# Patient Record
Sex: Female | Born: 2000 | Race: White | Hispanic: No | Marital: Single | Smoking: Never smoker
Health system: Southern US, Community
[De-identification: ages and names within clinical notes are randomized; demographics above are authoritative.]

## PROBLEM LIST (undated history)

## (undated) HISTORY — PX: WISDOM TOOTH EXTRACTION: SHX21

---
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
## 2022-02-14 ENCOUNTER — Other Ambulatory Visit: Payer: Self-pay

## 2022-02-14 ENCOUNTER — Emergency Department (HOSPITAL_COMMUNITY): Payer: Medicaid Other

## 2022-02-14 ENCOUNTER — Encounter (HOSPITAL_COMMUNITY): Payer: Self-pay | Admitting: *Deleted

## 2022-02-14 ENCOUNTER — Emergency Department (HOSPITAL_COMMUNITY)
Admission: EM | Admit: 2022-02-14 | Discharge: 2022-02-14 | Disposition: A | Discharge status: Home / Self Care | Payer: Medicaid Other | Attending: Emergency Medicine | Admitting: Emergency Medicine

## 2022-02-14 DIAGNOSIS — R350 Frequency of micturition: Secondary | ICD-10-CM | POA: Diagnosis present

## 2022-02-14 DIAGNOSIS — N3001 Acute cystitis with hematuria: Secondary | ICD-10-CM | POA: Insufficient documentation

## 2022-02-14 DIAGNOSIS — N200 Calculus of kidney: Secondary | ICD-10-CM | POA: Diagnosis not present

## 2022-02-14 DIAGNOSIS — R109 Unspecified abdominal pain: Secondary | ICD-10-CM | POA: Diagnosis not present

## 2022-02-14 DIAGNOSIS — N3289 Other specified disorders of bladder: Secondary | ICD-10-CM | POA: Diagnosis not present

## 2022-02-14 LAB — COMPREHENSIVE METABOLIC PANEL
ALT: 33 U/L (ref 0–44)
AST: 23 U/L (ref 15–41)
Albumin: 4.6 g/dL (ref 3.5–5.0)
Alkaline Phosphatase: 54 U/L (ref 38–126)
Anion gap: 7 (ref 5–15)
BUN: 15 mg/dL (ref 6–20)
CO2: 24 mmol/L (ref 22–32)
Calcium: 9.6 mg/dL (ref 8.9–10.3)
Chloride: 103 mmol/L (ref 98–111)
Creatinine, Ser: 0.62 mg/dL (ref 0.44–1.00)
GFR, Estimated: >60 mL/min (ref >60)
Glucose, Bld: 103 mg/dL — ABNORMAL HIGH (ref 70–99)
Potassium: 3.6 mmol/L (ref 3.5–5.1)
Sodium: 134 mmol/L — ABNORMAL LOW (ref 135–145)
Total Bilirubin: 0.5 mg/dL (ref 0.3–1.2)
Total Protein: 8.7 g/dL — ABNORMAL HIGH (ref 6.5–8.1)

## 2022-02-14 LAB — PREGNANCY, URINE: Preg Test, Ur: NEGATIVE

## 2022-02-14 LAB — URINALYSIS, ROUTINE W REFLEX MICROSCOPIC
Bilirubin Urine: NEGATIVE
Glucose, UA: NEGATIVE mg/dL
Ketones, ur: NEGATIVE mg/dL
Nitrite: NEGATIVE
Protein, ur: 30 mg/dL — AB
Specific Gravity, Urine: 1.012 (ref 1.005–1.030)
pH: 8 (ref 5.0–8.0)

## 2022-02-14 LAB — CBC WITH DIFFERENTIAL/PLATELET
Abs Immature Granulocytes: 0.02 10*3/uL (ref 0.00–0.07)
Basophils Absolute: 0 10*3/uL (ref 0.0–0.1)
Basophils Relative: 0 %
Eosinophils Absolute: 0 10*3/uL (ref 0.0–0.5)
Eosinophils Relative: 0 %
HCT: 41.4 % (ref 36.0–46.0)
Hemoglobin: 13.9 g/dL (ref 12.0–15.0)
Immature Granulocytes: 0 %
Lymphocytes Relative: 19 %
Lymphs Abs: 1.5 10*3/uL (ref 0.7–4.0)
MCH: 29.3 pg (ref 26.0–34.0)
MCHC: 33.6 g/dL (ref 30.0–36.0)
MCV: 87.3 fL (ref 80.0–100.0)
Monocytes Absolute: 0.6 10*3/uL (ref 0.1–1.0)
Monocytes Relative: 7 %
Neutro Abs: 5.8 10*3/uL (ref 1.7–7.7)
Neutrophils Relative %: 74 %
Platelets: 237 10*3/uL (ref 150–400)
RBC: 4.74 MIL/uL (ref 3.87–5.11)
RDW: 12.4 % (ref 11.5–15.5)
WBC: 8 10*3/uL (ref 4.0–10.5)
nRBC: 0 % (ref 0.0–0.2)

## 2022-02-14 MED ORDER — TAMSULOSIN HCL 0.4 MG PO CAPS: 0.4000 mg | ORAL_CAPSULE | Freq: Every day | ORAL | 0 refills | Status: AC

## 2022-02-14 MED ORDER — HYDROCODONE-ACETAMINOPHEN 5-325 MG PO TABS: 1.0000 | ORAL_TABLET | Freq: Four times a day (QID) | ORAL | 0 refills | Status: AC | PRN

## 2022-02-14 MED ORDER — SODIUM CHLORIDE 0.9 % IV BOLUS
1000.0000 mL | Freq: Once | INTRAVENOUS | Status: AC
  Administered 2022-02-14: 1000 mL via INTRAVENOUS

## 2022-02-14 MED ORDER — KETOROLAC TROMETHAMINE 30 MG/ML IJ SOLN
15.0000 mg | Freq: Once | INTRAMUSCULAR | Status: AC
  Administered 2022-02-14: 15 mg via INTRAVENOUS
  Filled 2022-02-14: qty 1

## 2022-02-14 MED ORDER — NITROFURANTOIN MONOHYD MACRO 100 MG PO CAPS: 100.0000 mg | ORAL_CAPSULE | Freq: Two times a day (BID) | ORAL | 0 refills | Status: AC

## 2022-02-14 MED ORDER — TAMSULOSIN HCL 0.4 MG PO CAPS
0.4000 mg | ORAL_CAPSULE | Freq: Once | ORAL | Status: AC
  Administered 2022-02-14: 0.4 mg via ORAL
  Filled 2022-02-14: qty 1

## 2022-02-14 MED ORDER — NITROFURANTOIN MONOHYD MACRO 100 MG PO CAPS
100.0000 mg | ORAL_CAPSULE | Freq: Once | ORAL | Status: AC
  Administered 2022-02-14: 100 mg via ORAL
  Filled 2022-02-14: qty 1

## 2022-02-14 NOTE — ED Triage Notes (Signed)
Pt with right flank pain since waking up this morning and gradually getting worse throughout the day. Denies any burning with urination or blood in urine.

## 2022-02-14 NOTE — ED Provider Notes (Signed)
[facility] EMERGENCY DEPARTMENT Provider Note   CSN: [id number] Arrival date & time: 02/14/22  1421     History  Chief Complaint  Patient presents with   Flank Pain    right    Samantha Pollard is a 21 y.o. female.   Flank Pain Pertinent negatives include no chest pain, no abdominal pain and no shortness of breath.   21 year old female presents emergency department with complaints of right-sided flank pain.  Patient states that pain began upon awakening this morning around 7 AM.  Is been constant nature since been intermittent and intensity.  Pain described as sharp in her right flank with radiation to her right groin.  Patient denies urinary symptoms including hematuria, dysuria.  Patient has noticed urinary frequency/urgency new onset today. Denies history of kidney stones in the past, fever, chills, abdominal pain, nausea/vomiting/diarrhea, chest pain, shortness of breath, vaginal symptoms.  Home Medications Prior to Admission medications   Medication Sig Start Date End Date Taking? Authorizing Provider  HYDROcodone-acetaminophen (NORCO/VICODIN) 5-325 MG tablet Take 1 tablet by mouth every 6 (six) hours as needed for severe pain. 02/14/22  Yes Sherian Maroon A, PA  nitrofurantoin, macrocrystal-monohydrate, (MACROBID) 100 MG capsule Take 1 capsule (100 mg total) by mouth 2 (two) times daily. 02/14/22  Yes Sherian Maroon A, PA  tamsulosin (FLOMAX) 0.4 MG CAPS capsule Take 1 capsule (0.4 mg total) by mouth daily. 02/14/22  Yes Sherian Maroon A, PA  drospirenone-ethinyl estradiol (YAZ) 3-0.02 MG tablet Take 1 tablet by mouth daily. 09/30/21   Gwenlyn Fudge, FNP  olopatadine (PATANOL) 0.1 % ophthalmic solution Place 1 drop into the right eye 2 (two) times daily. 11/14/21   Burgess Amor, PA-C  tretinoin (RETIN-A) 0.1 % cream Apply topically at bedtime. 09/30/21   Gwenlyn Fudge, FNP      Allergies    Patient has no known allergies.    Review of Systems   Review of Systems   Constitutional:  Negative for chills and fever.  HENT:  Negative for ear pain and sore throat.   Eyes:  Negative for pain and visual disturbance.  Respiratory:  Negative for cough and shortness of breath.   Cardiovascular:  Negative for chest pain and palpitations.  Gastrointestinal:  Negative for abdominal pain and vomiting.  Genitourinary:  Positive for flank pain, frequency and urgency. Negative for dysuria and hematuria.  Musculoskeletal:  Negative for arthralgias and back pain.  Skin:  Negative for color change and rash.  Neurological:  Negative for seizures and syncope.  All other systems reviewed and are negative.   Physical Exam Updated Vital Signs BP 102/70 (BP Location: Right Arm)   Pulse 70   Temp 97.9 F (36.6 C) (Oral)   Resp 16   Ht 5\' 1"  (1.549 m)   Wt 64.9 kg   LMP 01/31/2022   SpO2 100%   BMI 27.02 kg/m  Physical Exam Vitals and nursing note reviewed.  Constitutional:      General: She is not in acute distress.    Appearance: She is well-developed.  HENT:     Head: Normocephalic and atraumatic.     Nose: Nose normal.     Mouth/Throat:     Mouth: Mucous membranes are moist.     Pharynx: Oropharynx is clear.  Eyes:     Conjunctiva/sclera: Conjunctivae normal.  Cardiovascular:     Rate and Rhythm: Normal rate and regular rhythm.     Heart sounds: No murmur heard. Pulmonary:     Effort:  Pulmonary effort is normal. No respiratory distress.     Breath sounds: Normal breath sounds.  Abdominal:     Palpations: Abdomen is soft.     Tenderness: There is no abdominal tenderness. There is right CVA tenderness.  Musculoskeletal:        General: No swelling.     Cervical back: Neck supple.     Right lower leg: No edema.     Left lower leg: No edema.  Skin:    General: Skin is warm and dry.     Capillary Refill: Capillary refill takes less than 2 seconds.  Neurological:     General: No focal deficit present.     Mental Status: She is alert and oriented to  person, place, and time.  Psychiatric:        Mood and Affect: Mood normal.     ED Results / Procedures / Treatments   Labs (all labs ordered are listed, but only abnormal results are displayed) Labs Reviewed  URINALYSIS, ROUTINE W REFLEX MICROSCOPIC - Abnormal; Notable for the following components:      Result Value   Color, Urine STRAW (*)    Hgb urine dipstick SMALL (*)    Protein, ur 30 (*)    Leukocytes,Ua TRACE (*)    Bacteria, UA RARE (*)    All other components within normal limits  COMPREHENSIVE METABOLIC PANEL - Abnormal; Notable for the following components:   Sodium 134 (*)    Glucose, Bld 103 (*)    Total Protein 8.7 (*)    All other components within normal limits  PREGNANCY, URINE  CBC WITH DIFFERENTIAL/PLATELET    EKG None  Radiology CT Renal Stone Study  Result Date: 02/14/2022 CLINICAL DATA:  Right-sided flank pain for several hours EXAM: CT ABDOMEN AND PELVIS WITHOUT CONTRAST TECHNIQUE: Multidetector CT imaging of the abdomen and pelvis was performed following the standard protocol without IV contrast. RADIATION DOSE REDUCTION: This exam was performed according to the departmental dose-optimization program which includes automated exposure control, adjustment of the mA and/or kV according to patient size and/or use of iterative reconstruction technique. COMPARISON:  09/25/2017 FINDINGS: Lower chest: No acute abnormality. Hepatobiliary: No focal liver abnormality is seen. No gallstones, gallbladder wall thickening, or biliary dilatation. Pancreas: Unremarkable. No pancreatic ductal dilatation or surrounding inflammatory changes. Spleen: Normal in size without focal abnormality. Adrenals/Urinary Tract: Adrenal glands are within normal limits. Kidneys are well visualized bilaterally. No renal calculi are seen. Mild fullness of the right renal collecting system and right ureter is noted which extends to the level of the ureterovesical junction. Bladder is partially  distended. Stomach/Bowel: No obstructive or inflammatory changes of the colon are noted. The appendix is well visualized and within normal limits. Small bowel and stomach are within normal limits. Vascular/Lymphatic: No significant vascular findings are present. No enlarged abdominal or pelvic lymph nodes. Reproductive: Uterus and bilateral adnexa are unremarkable. Other: No abdominal wall hernia or abnormality. No abdominopelvic ascites. Musculoskeletal: No acute or significant osseous findings. IMPRESSION: Fullness of the right renal collecting system and right ureter extending to the level of the bladder as described. No definitive stone is seen. These changes are likely related to a poorly calcified stone distally or edema from recently passed stone. No other focal abnormality is noted. Electronically Signed   By: Alcide Clever M.D.   On: 02/14/2022 21:28    Procedures Procedures    Medications Ordered in ED Medications  tamsulosin (FLOMAX) capsule 0.4 mg (has no administration in  time range)  nitrofurantoin (macrocrystal-monohydrate) (MACROBID) capsule 100 mg (has no administration in time range)  ketorolac (TORADOL) 30 MG/ML injection 15 mg (15 mg Intravenous Given 02/14/22 2107)  sodium chloride 0.9 % bolus 1,000 mL (1,000 mLs Intravenous New Bag/Given 02/14/22 2108)    ED Course/ Medical Decision Making/ A&P                           Medical Decision Making Amount and/or Complexity of Data Reviewed Labs: ordered. Radiology: ordered.  Risk Prescription drug management.   This patient presents to the ED for concern of right-sided flank pain, this involves an extensive number of treatment options, and is a complaint that carries with it a high risk of complications and morbidity.  The differential diagnosis includes but are not limited to AAA, mesenteric ischemia, appendicitis, diverticulitis, DKA, gastritis, gastroenteritis, AMI, nephrolithiasis, pancreatitis, peritonitis, adrenal  insufficiency,lead poisoning, iron toxicity, intestinal ischemia, constipation, UTI,SBO/LBO, splenic rupture, biliary disease, IBD, IBS, PUD, or hepatitis. Ectopic pregnancy, ovarian torsion, PID.   Co morbidities that complicate the patient evaluation  N/a   Additional history obtained:  Additional history obtained from mother who is at bedside   Lab Tests:  I Ordered, and personally interpreted labs.  The pertinent results include: UA significant for small hemoglobin, 30 protein, trace leukocytes, RBC 11-20, WBC 11-20, rare bacteria.   Imaging Studies ordered:  I ordered imaging studies including CT renal stone I independently visualized and interpreted imaging which showed fullness of the right renal collecting system and right ureter extending to the level of the bladder as described.  No definitive stone is seen.  These changes are likely related to a poorly calcified stone distally or edema from recently passed stone. I agree with the radiologist interpretation  Cardiac Monitoring: / EKG:  The patient was maintained on a cardiac monitor.  I personally viewed and interpreted the cardiac monitored which showed an underlying rhythm of: Sinus rhythm   Consultations Obtained:  N/a   Problem List / ED Course / Critical interventions / Medication management  Right flank pain I ordered medication including Toradol for pain and normal saline   Reevaluation of the patient after these medicines showed that the patient improved I have reviewed the patients home medicines and have made adjustments as needed   Social Determinants of Health:  Denies tobacco, illicit drug use.   Test / Admission - Considered:  Right-sided flank pain Vitals signs significant within normal range and stable throughout visit. Laboratory/imaging studies significant for: UA significant for urinary tract infection as well as CT study indicative of either radio lucent renal stone or recently passed  stone. Patient symptoms are likely secondary to either radiolucent right-sided nephrolithiasis or recently passed nephrolithiasis.  We will treat with Flomax and pain medication outpatient.  Also superimposes a urinary tract infection will to be treated with antibiotics for the next 5 days.  Ambulatory referral set up for urology.  Instructed the patient and family to call the urologist office tomorrow to try to get in sooner. Worrisome signs and symptoms were discussed with the patient, and the patient acknowledged understanding to return to the ED if noticed. Patient was stable upon discharge.          Final Clinical Impression(s) / ED Diagnoses Final diagnoses:  Right flank pain  Acute cystitis with hematuria  Nephrolithiasis    Rx / DC Orders ED Discharge Orders          Ordered  Ambulatory referral to Urology       Comments: R Nephrolithiasis   02/14/22 2152    tamsulosin (FLOMAX) 0.4 MG CAPS capsule  Daily        02/14/22 2152    HYDROcodone-acetaminophen (NORCO/VICODIN) 5-325 MG tablet  Every 6 hours PRN        02/14/22 2152    nitrofurantoin, macrocrystal-monohydrate, (MACROBID) 100 MG capsule  2 times daily        02/14/22 2152              Peter Garter, [place] 02/14/22 2159    Gloris Manchester, MD 02/15/22 [phone]

## 2022-02-14 NOTE — Discharge Instructions (Addendum)
ForYour visit today was significant for a right-sided kidney stone as well as a urinary tract infection.  Flomax or tamsulosin to take every day as he experienced pain.  The antibiotic for urinary tract infection is called Macrobid.  You were given your first dose today.  His antibiotic you will take twice a day for the next 5 days.  I have prescribed pain medicine to take as needed until you are able to see her urologist.  An amatory referral to urology has been set up with [facility] [place].  They should call you within the next 2 to 3 days, but you can get in sooner if you call them early tomorrow morning.  They should have your records as well as your visit tonight.  Please do not hesitate to return to the emergency department if the worrisome signs and symptoms we discussed become apparent.

## 2022-02-15 ENCOUNTER — Ambulatory Visit (INDEPENDENT_AMBULATORY_CARE_PROVIDER_SITE_OTHER): Payer: Medicaid Other | Admitting: Urology

## 2022-02-15 VITALS — BP 107/72 | HR 99

## 2022-02-15 DIAGNOSIS — N12 Tubulo-interstitial nephritis, not specified as acute or chronic: Secondary | ICD-10-CM

## 2022-02-15 DIAGNOSIS — N200 Calculus of kidney: Secondary | ICD-10-CM

## 2022-02-15 LAB — URINALYSIS, ROUTINE W REFLEX MICROSCOPIC
Bilirubin, UA: NEGATIVE
Glucose, UA: NEGATIVE
Ketones, UA: NEGATIVE
Nitrite, UA: NEGATIVE
RBC, UA: NEGATIVE
Specific Gravity, UA: 1.015 (ref 1.005–1.030)
Urobilinogen, Ur: 0.2 mg/dL (ref 0.2–1.0)
pH, UA: 5.5 (ref 5.0–7.5)

## 2022-02-15 LAB — MICROSCOPIC EXAMINATION: RBC, Urine: NONE SEEN /hpf (ref 0–2)

## 2022-02-15 MED ORDER — FLUCONAZOLE 150 MG PO TABS: 150.0000 mg | ORAL_TABLET | Freq: Every day | ORAL | 1 refills | Status: AC

## 2022-02-15 MED ORDER — SULFAMETHOXAZOLE-TRIMETHOPRIM 800-160 MG PO TABS: 1.0000 | ORAL_TABLET | Freq: Two times a day (BID) | ORAL | 0 refills | Status: AC

## 2022-02-15 NOTE — Progress Notes (Signed)
02/15/2022 9:25 AM   Samantha Pollard 2001-05-16 [id number]  Referring provider: Gwenlyn Fudge, FNP [address] [place],  [place] [address]  Right flank pain   HPI: Samantha Pollard is a 20yo here for evaluation of right hydronephrosis. She developed right flank pain 1 week ago and presented to the ER on 6/19. She underwent CT on 6/19 which showed mild right hydronephrosis to the level of the UVJ without definitive calculus. UA in the ER was concerning for infection and patient was given prescription for macrobid. She denies any fevers. No history of nephrolithiasis and no family history of nephrolithiasis. UA today is concerning for infection.   PMH: No past medical history on file.  Surgical History: Past Surgical History:  Procedure Laterality Date   WISDOM TOOTH EXTRACTION      Home Medications:  Allergies as of 02/15/2022   No Known Allergies      Medication List        Accurate as of February 15, 2022  9:25 AM. If you have any questions, ask your nurse or doctor.          drospirenone-ethinyl estradiol 3-0.02 MG tablet Commonly known as: YAZ Take 1 tablet by mouth daily.   HYDROcodone-acetaminophen 5-325 MG tablet Commonly known as: NORCO/VICODIN Take 1 tablet by mouth every 6 (six) hours as needed for severe pain.   nitrofurantoin (macrocrystal-monohydrate) 100 MG capsule Commonly known as: MACROBID Take 1 capsule (100 mg total) by mouth 2 (two) times daily.   olopatadine 0.1 % ophthalmic solution Commonly known as: Patanol Place 1 drop into the right eye 2 (two) times daily.   tamsulosin 0.4 MG Caps capsule Commonly known as: FLOMAX Take 1 capsule (0.4 mg total) by mouth daily.   tretinoin 0.1 % cream Commonly known as: RETIN-A Apply topically at bedtime.        Allergies: No Known Allergies  Family History: Family History  Problem Relation Age of Onset   Heart murmur Mother    Hyperlipidemia Mother    Thyroid disease Father    Hyperlipidemia  Maternal Grandmother    Skin cancer Maternal Grandmother    Hypertension Maternal Grandfather     Social History:  reports that she has never smoked. She has never used smokeless tobacco. She reports that she does not drink alcohol and does not use drugs.  ROS: All other review of systems were reviewed and are negative except what is noted above in HPI  Physical Exam: BP 107/72   Pulse 99   LMP 01/31/2022   Constitutional:  Alert and oriented, No acute distress. HEENT: [place] AT, moist mucus membranes.  Trachea midline, no masses. Cardiovascular: No clubbing, cyanosis, or edema. Respiratory: Normal respiratory effort, no increased work of breathing. GI: Abdomen is soft, nontender, nondistended, no abdominal masses GU: No CVA tenderness.  Lymph: No cervical or inguinal lymphadenopathy. Skin: No rashes, bruises or suspicious lesions. Neurologic: Grossly intact, no focal deficits, moving all 4 extremities. Psychiatric: Normal mood and affect.  Laboratory Data: Lab Results  Component Value Date   WBC 8.0 02/14/2022   HGB 13.9 02/14/2022   HCT 41.4 02/14/2022   MCV 87.3 02/14/2022   PLT 237 02/14/2022    Lab Results  Component Value Date   CREATININE 0.62 02/14/2022    No results found for: "PSA"  No results found for: "TESTOSTERONE"  No results found for: "HGBA1C"  Urinalysis    Component Value Date/Time   COLORURINE STRAW (A) 02/14/2022 1520   APPEARANCEUR CLEAR  02/14/2022 1520   LABSPEC 1.012 02/14/2022 1520   PHURINE 8.0 02/14/2022 1520   GLUCOSEU NEGATIVE 02/14/2022 1520   HGBUR SMALL (A) 02/14/2022 1520   BILIRUBINUR NEGATIVE 02/14/2022 1520   KETONESUR NEGATIVE 02/14/2022 1520   PROTEINUR 30 (A) 02/14/2022 1520   NITRITE NEGATIVE 02/14/2022 1520   LEUKOCYTESUR TRACE (A) 02/14/2022 1520    Lab Results  Component Value Date   BACTERIA RARE (A) 02/14/2022    Pertinent Imaging: CT 02/14/2022: Images reviewed and discussed with the patient  Results for  orders placed during the hospital encounter of 11/27/06  DG Abd 1 View  Narrative Clinical Data: Vomiting. Incontinence. PORTABLE ABDOMEN: Findings: Mildly distended colon is noted with some fluid and stool. There are no dilated small bowel loops present. No abnormal calcifications are identified. The bony structures are within normal limits.  Impression Nonspecific bowel gas pattern with mildly distended colon.  Provider: Fransico Setters  No results found for this or any previous visit.  No results found for this or any previous visit.  No results found for this or any previous visit.  No results found for this or any previous visit.  No results found for this or any previous visit.  No results found for this or any previous visit.  Results for orders placed during the hospital encounter of 02/14/22  CT Renal Stone Study  Narrative CLINICAL DATA:  Right-sided flank pain for several hours  EXAM: CT ABDOMEN AND PELVIS WITHOUT CONTRAST  TECHNIQUE: Multidetector CT imaging of the abdomen and pelvis was performed following the standard protocol without IV contrast.  RADIATION DOSE REDUCTION: This exam was performed according to the departmental dose-optimization program which includes automated exposure control, adjustment of the mA and/or kV according to patient size and/or use of iterative reconstruction technique.  COMPARISON:  09/25/2017  FINDINGS: Lower chest: No acute abnormality.  Hepatobiliary: No focal liver abnormality is seen. No gallstones, gallbladder wall thickening, or biliary dilatation.  Pancreas: Unremarkable. No pancreatic ductal dilatation or surrounding inflammatory changes.  Spleen: Normal in size without focal abnormality.  Adrenals/Urinary Tract: Adrenal glands are within normal limits. Kidneys are well visualized bilaterally. No renal calculi are seen. Mild fullness of the right renal collecting system and right ureter is noted which  extends to the level of the ureterovesical junction. Bladder is partially distended.  Stomach/Bowel: No obstructive or inflammatory changes of the colon are noted. The appendix is well visualized and within normal limits. Small bowel and stomach are within normal limits.  Vascular/Lymphatic: No significant vascular findings are present. No enlarged abdominal or pelvic lymph nodes.  Reproductive: Uterus and bilateral adnexa are unremarkable.  Other: No abdominal wall hernia or abnormality. No abdominopelvic ascites.  Musculoskeletal: No acute or significant osseous findings.  IMPRESSION: Fullness of the right renal collecting system and right ureter extending to the level of the bladder as described. No definitive stone is seen. These changes are likely related to a poorly calcified stone distally or edema from recently passed stone.  No other focal abnormality is noted.   Electronically Signed By: Alcide Clever M.D. On: 02/14/2022 21:28   Assessment & Plan:    1. pyelonephritis -urine for culture -Bactrim DS BID for 14 days -RTC 1 month with renal US - Urinalysis, Routine w reflex microscopic   No follow-ups on file.  Wilkie Aye, MD  [facility]

## 2022-02-17 LAB — URINE CULTURE

## 2022-02-22 ENCOUNTER — Encounter: Payer: Self-pay | Admitting: Urology

## 2022-03-18 DIAGNOSIS — N3000 Acute cystitis without hematuria: Secondary | ICD-10-CM | POA: Diagnosis not present

## 2022-03-18 DIAGNOSIS — N390 Urinary tract infection, site not specified: Secondary | ICD-10-CM | POA: Diagnosis present

## 2022-03-19 ENCOUNTER — Encounter (HOSPITAL_COMMUNITY): Payer: Self-pay | Admitting: Emergency Medicine

## 2022-03-19 ENCOUNTER — Emergency Department (HOSPITAL_COMMUNITY)
Admission: EM | Admit: 2022-03-19 | Discharge: 2022-03-19 | Disposition: A | Discharge status: Home / Self Care | Payer: Medicaid Other | Attending: Emergency Medicine | Admitting: Emergency Medicine

## 2022-03-19 DIAGNOSIS — N3000 Acute cystitis without hematuria: Secondary | ICD-10-CM

## 2022-03-19 LAB — URINALYSIS, ROUTINE W REFLEX MICROSCOPIC
Bilirubin Urine: NEGATIVE
Glucose, UA: NEGATIVE mg/dL
Hgb urine dipstick: NEGATIVE
Ketones, ur: NEGATIVE mg/dL
Nitrite: NEGATIVE
Protein, ur: NEGATIVE mg/dL
Specific Gravity, Urine: 1.023 (ref 1.005–1.030)
pH: 7 (ref 5.0–8.0)

## 2022-03-19 LAB — PREGNANCY, URINE: Preg Test, Ur: NEGATIVE

## 2022-03-19 MED ORDER — CEPHALEXIN 500 MG PO CAPS
500.0000 mg | ORAL_CAPSULE | Freq: Once | ORAL | Status: AC
  Administered 2022-03-19: 500 mg via ORAL
  Filled 2022-03-19: qty 1

## 2022-03-19 MED ORDER — CEPHALEXIN 500 MG PO CAPS: 500.0000 mg | ORAL_CAPSULE | Freq: Three times a day (TID) | ORAL | 0 refills | Status: AC

## 2022-03-19 NOTE — Discharge Instructions (Addendum)
You were evaluated in the Emergency Department and after careful evaluation, we did not find any emergent condition requiring admission or further testing in the hospital.  Your exam/testing today was overall reassuring.  Urine sample is consistent with infection.  Please take the keflex antibiotics as directed.  Please return to the Emergency Department if you experience any worsening of your condition.  Thank you for allowing [place] to be a part of your care.

## 2022-03-19 NOTE — ED Triage Notes (Signed)
Pt c/o UTI with lower abd pain that started tonight. Pt recently dx with UTI and kidney stones prescribed abx but only took them for a few days.

## 2022-03-19 NOTE — ED Provider Notes (Signed)
AP-EMERGENCY DEPT [facility] Department Provider Note MRN:  [id number]  Arrival date & time: 03/19/22     Chief Complaint   Urinary Tract Infection   History of Present Illness   Samantha Pollard is a 21 y.o. year-old female with no pertinent past medical history presenting to the ED with chief complaint of urinary tract infection.  Patient explains that she recently had a kidney stone and also a urinary tract infection and was given antibiotics.  She took a week of the antibiotics but did not finish them.  Over the past few days she has had a return of dysuria.  No flank pain, no fever.  Mild suprapubic pain.  Review of Systems  A thorough review of systems was obtained and all systems are negative except as noted in the HPI and PMH.   Patient's Health History   History reviewed. No pertinent past medical history.  Past Surgical History:  Procedure Laterality Date   WISDOM TOOTH EXTRACTION      Family History  Problem Relation Age of Onset   Heart murmur Mother    Hyperlipidemia Mother    Thyroid disease Father    Hyperlipidemia Maternal Grandmother    Skin cancer Maternal Grandmother    Hypertension Maternal Grandfather     Social History   Socioeconomic History   Marital status: Single    Spouse name: Not on file   Number of children: Not on file   Years of education: Not on file   Highest education level: Not on file  Occupational History   Not on file  Tobacco Use   Smoking status: Never   Smokeless tobacco: Never  Vaping Use   Vaping Use: Never used  Substance and Sexual Activity   Alcohol use: No   Drug use: No   Sexual activity: Not on file  Other Topics Concern   Not on file  Social History Narrative   Not on file   Social Determinants of Health   Financial Resource Strain: Not on file  Food Insecurity: Not on file  Transportation Needs: Not on file  Physical Activity: Not on file  Stress: Not on file  Social Connections: Not on  file  Intimate Partner Violence: Not on file     Physical Exam   Vitals:   03/19/22 0052  BP: 91/65  Pulse: 92  Resp: 17  SpO2: 100%    CONSTITUTIONAL: Well-appearing, NAD NEURO/PSYCH:  Alert and oriented x 3, no focal deficits EYES:  eyes equal and reactive ENT/NECK:  no LAD, no JVD CARDIO: Regular rate, well-perfused, normal S1 and S2 PULM:  CTAB no wheezing or rhonchi GI/GU:  non-distended, non-tender MSK/SPINE:  No gross deformities, no edema SKIN:  no rash, atraumatic   *Additional and/or pertinent findings included in MDM below  Diagnostic and Interventional Summary    EKG Interpretation  Date/Time:    Ventricular Rate:    PR Interval:    QRS Duration:   QT Interval:    QTC Calculation:   R Axis:     Text Interpretation:         Labs Reviewed  URINALYSIS, ROUTINE W REFLEX MICROSCOPIC - Abnormal; Notable for the following components:      Result Value   Leukocytes,Ua TRACE (*)    Bacteria, UA RARE (*)    All other components within normal limits  PREGNANCY, URINE    No orders to display    Medications  cephALEXin (KEFLEX) capsule 500 mg (has no administration in  time range)     Procedures  /  Critical Care Procedures  ED Course and Medical Decision Making  Initial Impression and Ddx History suspicious for urinary tract infection.  Without fever or flank pain there does not seem to be any complicating features.  Did have a recent kidney stone.  Awaiting urinalysis.  Past medical/surgical history that increases complexity of ED encounter: Kidney stone  Interpretation of Diagnostics I personally reviewed the urinalysis and my interpretation is as follows: Suspicion for infection given the white blood cell count and symptoms    Patient Reassessment and Ultimate Disposition/Management     Discharge on Keflex.  Patient management required discussion with the following services or consulting groups:  None  Complexity of Problems  Addressed Acute complicated illness or Injury  Additional Data Reviewed and Analyzed Further history obtained from: Further history from spouse/family member  Additional Factors Impacting ED Encounter Risk Prescriptions  Elmer Sow. Pilar Plate, MD [facility] [email or web address]  Final Clinical Impressions(s) / ED Diagnoses     ICD-10-CM   1. Acute cystitis without hematuria  N30.00       ED Discharge Orders     None        Discharge Instructions Discussed with and Provided to Patient:     Discharge Instructions      You were evaluated in the Emergency Department and after careful evaluation, we did not find any emergent condition requiring admission or further testing in the hospital.  Your exam/testing today was overall reassuring.  Urine sample is consistent with infection.  Please take the keflex antibiotics as directed.  Please return to the Emergency Department if you experience any worsening of your condition.  Thank you for allowing [place] to be a part of your care.        Sabas Sous, MD 03/19/22 0157

## 2022-03-21 ENCOUNTER — Ambulatory Visit (HOSPITAL_COMMUNITY)
Admission: RE | Admit: 2022-03-21 | Discharge: 2022-03-21 | Disposition: A | Discharge status: Home / Self Care | Payer: Medicaid Other | Source: Ambulatory Visit | Attending: Urology | Admitting: Urology

## 2022-03-21 DIAGNOSIS — N12 Tubulo-interstitial nephritis, not specified as acute or chronic: Secondary | ICD-10-CM | POA: Diagnosis not present

## 2022-03-21 DIAGNOSIS — N3289 Other specified disorders of bladder: Secondary | ICD-10-CM | POA: Diagnosis not present

## 2022-03-28 ENCOUNTER — Ambulatory Visit: Payer: Medicaid Other | Admitting: Urology

## 2022-03-28 DIAGNOSIS — N200 Calculus of kidney: Secondary | ICD-10-CM

## 2022-04-01 ENCOUNTER — Ambulatory Visit (INDEPENDENT_AMBULATORY_CARE_PROVIDER_SITE_OTHER): Payer: Medicaid Other | Admitting: Urology

## 2022-04-01 VITALS — BP 108/72 | HR 101

## 2022-04-01 DIAGNOSIS — N200 Calculus of kidney: Secondary | ICD-10-CM | POA: Diagnosis not present

## 2022-04-01 DIAGNOSIS — N1330 Unspecified hydronephrosis: Secondary | ICD-10-CM

## 2022-04-01 LAB — URINALYSIS, ROUTINE W REFLEX MICROSCOPIC
Bilirubin, UA: NEGATIVE
Glucose, UA: NEGATIVE
Ketones, UA: NEGATIVE
Leukocytes,UA: NEGATIVE
Nitrite, UA: NEGATIVE
Protein,UA: NEGATIVE
RBC, UA: NEGATIVE
Specific Gravity, UA: 1.02 (ref 1.005–1.030)
Urobilinogen, Ur: 0.2 mg/dL (ref 0.2–1.0)
pH, UA: 6 (ref 5.0–7.5)

## 2022-04-01 NOTE — Patient Instructions (Signed)

## 2022-04-09 ENCOUNTER — Encounter: Payer: Self-pay | Admitting: Urology

## 2022-04-09 NOTE — Progress Notes (Signed)
04/01/2022 5:18 PM   Samantha Pollard [birth year] [id number]  Referring provider: Gwenlyn Fudge, FNP [address] [place],  [place] [address]  Followup right hydronephrosis   HPI: Samantha Pollard is a 21yo here for followup for nephrolithiasis and right hydronephrosis. She denies any flank pain. No significant LUTS. Renal US 7/24 shows resolution of the right hydronephrosis. No other complaints today   PMH: No past medical history on file.  Surgical History: Past Surgical History:  Procedure Laterality Date   WISDOM TOOTH EXTRACTION      Home Medications:  Allergies as of 04/01/2022   No Known Allergies      Medication List        Accurate as of April 01, 2022 11:59 PM. If you have any questions, ask your nurse or doctor.          drospirenone-ethinyl estradiol 3-0.02 MG tablet Commonly known as: YAZ Take 1 tablet by mouth daily.   fluconazole 150 MG tablet Commonly known as: DIFLUCAN Take 1 tablet (150 mg total) by mouth daily.   HYDROcodone-acetaminophen 5-325 MG tablet Commonly known as: NORCO/VICODIN Take 1 tablet by mouth every 6 (six) hours as needed for severe pain.   nitrofurantoin (macrocrystal-monohydrate) 100 MG capsule Commonly known as: MACROBID Take 1 capsule (100 mg total) by mouth 2 (two) times daily.   olopatadine 0.1 % ophthalmic solution Commonly known as: Patanol Place 1 drop into the right eye 2 (two) times daily.   sulfamethoxazole-trimethoprim 800-160 MG tablet Commonly known as: BACTRIM DS Take 1 tablet by mouth every 12 (twelve) hours.   tamsulosin 0.4 MG Caps capsule Commonly known as: FLOMAX Take 1 capsule (0.4 mg total) by mouth daily.   tretinoin 0.1 % cream Commonly known as: RETIN-A Apply topically at bedtime.        Allergies: No Known Allergies  Family History: Family History  Problem Relation Age of Onset   Heart murmur Mother    Hyperlipidemia Mother    Thyroid disease Father    Hyperlipidemia Maternal  Grandmother    Skin cancer Maternal Grandmother    Hypertension Maternal Grandfather     Social History:  reports that she has never smoked. She has never used smokeless tobacco. She reports that she does not drink alcohol and does not use drugs.  ROS: All other review of systems were reviewed and are negative except what is noted above in HPI  Physical Exam: BP 108/72   Pulse (!) 101   Constitutional:  Alert and oriented, No acute distress. HEENT: [place] AT, moist mucus membranes.  Trachea midline, no masses. Cardiovascular: No clubbing, cyanosis, or edema. Respiratory: Normal respiratory effort, no increased work of breathing. GI: Abdomen is soft, nontender, nondistended, no abdominal masses GU: No CVA tenderness.  Lymph: No cervical or inguinal lymphadenopathy. Skin: No rashes, bruises or suspicious lesions. Neurologic: Grossly intact, no focal deficits, moving all 4 extremities. Psychiatric: Normal mood and affect.  Laboratory Data: Lab Results  Component Value Date   WBC 8.0 02/14/2022   HGB 13.9 02/14/2022   HCT 41.4 02/14/2022   MCV 87.3 02/14/2022   PLT 237 02/14/2022    Lab Results  Component Value Date   CREATININE 0.62 02/14/2022    No results found for: "PSA"  No results found for: "TESTOSTERONE"  No results found for: "HGBA1C"  Urinalysis    Component Value Date/Time   COLORURINE YELLOW 03/19/2022 0057   APPEARANCEUR Clear 04/01/2022 1108   LABSPEC 1.023 03/19/2022 0057   PHURINE 7.0  03/19/2022 0057   GLUCOSEU Negative 04/01/2022 1108   HGBUR NEGATIVE 03/19/2022 0057   BILIRUBINUR Negative 04/01/2022 1108   KETONESUR NEGATIVE 03/19/2022 0057   PROTEINUR Negative 04/01/2022 1108   PROTEINUR NEGATIVE 03/19/2022 0057   NITRITE Negative 04/01/2022 1108   NITRITE NEGATIVE 03/19/2022 0057   LEUKOCYTESUR Negative 04/01/2022 1108   LEUKOCYTESUR TRACE (A) 03/19/2022 0057    Lab Results  Component Value Date   LABMICR Comment 04/01/2022   WBCUA 11-30  (A) 02/15/2022   LABEPIT 0-10 02/15/2022   BACTERIA RARE (A) 03/19/2022    Pertinent Imaging: Renal US 03/21/2022: Images reviewed and discussed with the patient  Results for orders placed during the hospital encounter of 11/27/06  DG Abd 1 View  Narrative Clinical Data: Vomiting. Incontinence. PORTABLE ABDOMEN: Findings: Mildly distended colon is noted with some fluid and stool. There are no dilated small bowel loops present. No abnormal calcifications are identified. The bony structures are within normal limits.  Impression Nonspecific bowel gas pattern with mildly distended colon.  Provider: Fransico Setters  No results found for this or any previous visit.  No results found for this or any previous visit.  No results found for this or any previous visit.  Results for orders placed during the hospital encounter of 03/21/22  Ultrasound renal complete  Narrative CLINICAL DATA:  Pyelonephritis.  Nephrolithiasis.  EXAM: RENAL / URINARY TRACT ULTRASOUND COMPLETE  COMPARISON:  CT scan of the abdomen pelvis without contrast February 14, 2022  FINDINGS: Right Kidney:  Renal measurements: 10.0 x 4.1 x 5.2 cm = volume: 112 mL. Echogenicity within normal limits. No mass or hydronephrosis visualized.  Left Kidney:  Renal measurements: 10.9 x 4.1 x 4.8 cm = volume: 111 mL. Echogenicity within normal limits. No mass or hydronephrosis visualized.  Bladder:  Evaluation of the bladder is limited due to lack of distention. No obvious abnormalities.  Other:  None.  IMPRESSION: 1. Right left kidney are normal in appearance. 2. Evaluation of the bladder is limited due to lack of distention. No obvious abnormalities identified.   Electronically Signed By: Gerome Sam III M.D. On: 03/21/2022 14:44  No results found for this or any previous visit.  No results found for this or any previous visit.  Results for orders placed during the hospital encounter of  02/14/22  CT Renal Stone Study  Narrative CLINICAL DATA:  Right-sided flank pain for several hours  EXAM: CT ABDOMEN AND PELVIS WITHOUT CONTRAST  TECHNIQUE: Multidetector CT imaging of the abdomen and pelvis was performed following the standard protocol without IV contrast.  RADIATION DOSE REDUCTION: This exam was performed according to the departmental dose-optimization program which includes automated exposure control, adjustment of the mA and/or kV according to patient size and/or use of iterative reconstruction technique.  COMPARISON:  09/25/2017  FINDINGS: Lower chest: No acute abnormality.  Hepatobiliary: No focal liver abnormality is seen. No gallstones, gallbladder wall thickening, or biliary dilatation.  Pancreas: Unremarkable. No pancreatic ductal dilatation or surrounding inflammatory changes.  Spleen: Normal in size without focal abnormality.  Adrenals/Urinary Tract: Adrenal glands are within normal limits. Kidneys are well visualized bilaterally. No renal calculi are seen. Mild fullness of the right renal collecting system and right ureter is noted which extends to the level of the ureterovesical junction. Bladder is partially distended.  Stomach/Bowel: No obstructive or inflammatory changes of the colon are noted. The appendix is well visualized and within normal limits. Small bowel and stomach are within normal limits.  Vascular/Lymphatic: No significant vascular  findings are present. No enlarged abdominal or pelvic lymph nodes.  Reproductive: Uterus and bilateral adnexa are unremarkable.  Other: No abdominal wall hernia or abnormality. No abdominopelvic ascites.  Musculoskeletal: No acute or significant osseous findings.  IMPRESSION: Fullness of the right renal collecting system and right ureter extending to the level of the bladder as described. No definitive stone is seen. These changes are likely related to a poorly calcified stone distally  or edema from recently passed stone.  No other focal abnormality is noted.   Electronically Signed By: Alcide Clever M.D. On: 02/14/2022 21:28   Assessment & Plan:    1. Kidney stones RTc 6 months with a renal US - Urinalysis, Routine w reflex microscopic - US RENAL; Future   Return in about 6 months (around 10/02/2022) for renal US.  Wilkie Aye, MD  [facility] [place]

## 2022-09-21 ENCOUNTER — Ambulatory Visit (HOSPITAL_COMMUNITY): Payer: Medicaid Other

## 2022-10-05 ENCOUNTER — Ambulatory Visit: Payer: Medicaid Other | Admitting: Urology

## 2022-10-10 ENCOUNTER — Ambulatory Visit: Payer: Medicaid Other | Admitting: Urology

## 2022-10-21 ENCOUNTER — Ambulatory Visit (HOSPITAL_COMMUNITY)
Admission: RE | Admit: 2022-10-21 | Discharge: 2022-10-21 | Disposition: A | Discharge status: Home / Self Care | Payer: BC Managed Care – PPO | Source: Ambulatory Visit | Attending: Urology | Admitting: Urology

## 2022-10-21 ENCOUNTER — Encounter: Payer: Self-pay | Admitting: Urology

## 2022-10-21 ENCOUNTER — Ambulatory Visit (INDEPENDENT_AMBULATORY_CARE_PROVIDER_SITE_OTHER): Payer: BC Managed Care – PPO | Admitting: Urology

## 2022-10-21 VITALS — BP 111/73 | HR 93

## 2022-10-21 DIAGNOSIS — N200 Calculus of kidney: Secondary | ICD-10-CM

## 2022-10-21 LAB — URINALYSIS, ROUTINE W REFLEX MICROSCOPIC
Bilirubin, UA: NEGATIVE
Glucose, UA: NEGATIVE
Ketones, UA: NEGATIVE
Leukocytes,UA: NEGATIVE
Nitrite, UA: NEGATIVE
Protein,UA: NEGATIVE
RBC, UA: NEGATIVE
Specific Gravity, UA: <1.005 — ABNORMAL LOW (ref 1.005–1.030)
Urobilinogen, Ur: 0.2 mg/dL (ref 0.2–1.0)
pH, UA: 6.5 (ref 5.0–7.5)

## 2022-10-21 NOTE — Patient Instructions (Signed)

## 2022-10-21 NOTE — Progress Notes (Signed)
10/21/2022 12:17 PM   [name] 2001-01-27 [id number]  Referring provider: Loman [place], [place],  [place] [address]  Followup nephrolithiasis   HPI: Ms Thielemann is a 22yo here for followup for nephrolithiasis. No stone events since last visit. Renal US shows no calculi. She drinks 64oz plus daily. No significant LUTS. No flank pain.    PMH: No past medical history on file.  Surgical History: Past Surgical History:  Procedure Laterality Date   WISDOM TOOTH EXTRACTION      Home Medications:  Allergies as of 10/21/2022   No Known Allergies      Medication List        Accurate as of October 21, 2022 12:17 PM. If you have any questions, ask your nurse or doctor.          drospirenone-ethinyl estradiol 3-0.02 MG tablet Commonly known as: YAZ Take 1 tablet by mouth daily.   fluconazole 150 MG tablet Commonly known as: DIFLUCAN Take 1 tablet (150 mg total) by mouth daily.   HYDROcodone-acetaminophen 5-325 MG tablet Commonly known as: NORCO/VICODIN Take 1 tablet by mouth every 6 (six) hours as needed for severe pain.   nitrofurantoin (macrocrystal-monohydrate) 100 MG capsule Commonly known as: MACROBID Take 1 capsule (100 mg total) by mouth 2 (two) times daily.   olopatadine 0.1 % ophthalmic solution Commonly known as: Patanol Place 1 drop into the right eye 2 (two) times daily.   sulfamethoxazole-trimethoprim 800-160 MG tablet Commonly known as: BACTRIM DS Take 1 tablet by mouth every 12 (twelve) hours.   tamsulosin 0.4 MG Caps capsule Commonly known as: FLOMAX Take 1 capsule (0.4 mg total) by mouth daily.   tretinoin 0.1 % cream Commonly known as: RETIN-A Apply topically at bedtime.        Allergies: No Known Allergies  Family History: Family History  Problem Relation Age of Onset   Heart murmur Mother    Hyperlipidemia Mother    Thyroid disease Father    Hyperlipidemia Maternal Grandmother    Skin cancer Maternal  Grandmother    Hypertension Maternal Grandfather     Social History:  reports that she has never smoked. She has never used smokeless tobacco. She reports that she does not drink alcohol and does not use drugs.  ROS: All other review of systems were reviewed and are negative except what is noted above in HPI  Physical Exam: BP 111/73   Pulse 93   Constitutional:  Alert and oriented, No acute distress. HEENT: [place] AT, moist mucus membranes.  Trachea midline, no masses. Cardiovascular: No clubbing, cyanosis, or edema. Respiratory: Normal respiratory effort, no increased work of breathing. GI: Abdomen is soft, nontender, nondistended, no abdominal masses GU: No CVA tenderness.  Lymph: No cervical or inguinal lymphadenopathy. Skin: No rashes, bruises or suspicious lesions. Neurologic: Grossly intact, no focal deficits, moving all 4 extremities. Psychiatric: Normal mood and affect.  Laboratory Data: Lab Results  Component Value Date   WBC 8.0 02/14/2022   HGB 13.9 02/14/2022   HCT 41.4 02/14/2022   MCV 87.3 02/14/2022   PLT 237 02/14/2022    Lab Results  Component Value Date   CREATININE 0.62 02/14/2022    No results found for: "PSA"  No results found for: "TESTOSTERONE"  No results found for: "HGBA1C"  Urinalysis    Component Value Date/Time   COLORURINE YELLOW 03/19/2022 0057   APPEARANCEUR Clear 04/01/2022 1108   LABSPEC 1.023 03/19/2022 0057   PHURINE 7.0 03/19/2022 0057  GLUCOSEU Negative 04/01/2022 1108   HGBUR NEGATIVE 03/19/2022 0057   BILIRUBINUR Negative 04/01/2022 1108   [place] 03/19/2022 0057   PROTEINUR Negative 04/01/2022 1108   PROTEINUR NEGATIVE 03/19/2022 0057   NITRITE Negative 04/01/2022 1108   NITRITE NEGATIVE 03/19/2022 0057   LEUKOCYTESUR Negative 04/01/2022 1108   LEUKOCYTESUR TRACE (A) 03/19/2022 0057    Lab Results  Component Value Date   LABMICR Comment 04/01/2022   WBCUA 11-30 (A) 02/15/2022   LABEPIT 0-10 02/15/2022    BACTERIA RARE (A) 03/19/2022    Pertinent Imaging: Renal US today: Images reviewed and discussed with the patient  Results for orders placed during the hospital encounter of 11/27/06  DG Abd 1 View  Narrative Clinical Data: Vomiting. Incontinence. PORTABLE ABDOMEN: Findings: Mildly distended colon is noted with some fluid and stool. There are no dilated small bowel loops present. No abnormal calcifications are identified. The bony structures are within normal limits.  Impression Nonspecific bowel gas pattern with mildly distended colon.  Provider: [place]  No results found for this or any previous visit.  No results found for this or any previous visit.  No results found for this or any previous visit.  Results for orders placed during the hospital encounter of 10/21/22  US RENAL  Narrative CLINICAL DATA:  Concern for nephrolithiasis  EXAM: RENAL / URINARY TRACT ULTRASOUND COMPLETE  COMPARISON:  02/14/2022, 03/21/2022  FINDINGS: Right Kidney:  Renal measurements: 9.9 x 4.1 x 5.9 cm = volume: 125 mL. Echogenicity within normal limits. No mass or hydronephrosis visualized.  Left Kidney:  Renal measurements: 10.5 x 4.6 x 4.7 cm = volume: 117 mL. Echogenicity within normal limits. No mass or hydronephrosis visualized.  Bladder:  Appears normal for degree of bladder distention. Only the left ureteral jet was visualized.  Other:  None.  IMPRESSION: No acute or significant finding by ultrasound.   Electronically Signed By: Jerilynn Mages.  Shick M.D. On: 10/21/2022 10:31  No valid procedures specified. No results found for this or any previous visit.  Results for orders placed during the hospital encounter of 02/14/22  CT Renal Stone Study  Narrative CLINICAL DATA:  Right-sided flank pain for several hours  EXAM: CT ABDOMEN AND PELVIS WITHOUT CONTRAST  TECHNIQUE: Multidetector CT imaging of the abdomen and pelvis was performed following the standard  protocol without IV contrast.  RADIATION DOSE REDUCTION: This exam was performed according to the departmental dose-optimization program which includes automated exposure control, adjustment of the mA and/or kV according to patient size and/or use of iterative reconstruction technique.  COMPARISON:  09/25/2017  FINDINGS: Lower chest: No acute abnormality.  Hepatobiliary: No focal liver abnormality is seen. No gallstones, gallbladder wall thickening, or biliary dilatation.  Pancreas: Unremarkable. No pancreatic ductal dilatation or surrounding inflammatory changes.  Spleen: Normal in size without focal abnormality.  Adrenals/Urinary Tract: Adrenal glands are within normal limits. Kidneys are well visualized bilaterally. No renal calculi are seen. Mild fullness of the right renal collecting system and right ureter is noted which extends to the level of the ureterovesical junction. Bladder is partially distended.  Stomach/Bowel: No obstructive or inflammatory changes of the colon are noted. The appendix is well visualized and within normal limits. Small bowel and stomach are within normal limits.  Vascular/Lymphatic: No significant vascular findings are present. No enlarged abdominal or pelvic lymph nodes.  Reproductive: Uterus and bilateral adnexa are unremarkable.  Other: No abdominal wall hernia or abnormality. No abdominopelvic ascites.  Musculoskeletal: No acute or significant osseous findings.  IMPRESSION: Fullness of the right renal collecting system and right ureter extending to the level of the bladder as described. No definitive stone is seen. These changes are likely related to a poorly calcified stone distally or edema from recently passed stone.  No other focal abnormality is noted.   Electronically Signed By: Inez Catalina M.D. On: 02/14/2022 21:28   Assessment & Plan:    1. Kidney stones -dietary handout given -followup 1year with renal US -  Urinalysis, Routine w reflex microscopic   No follow-ups on file.  Nicolette Bang, MD  [facility] [place]

## 2024-02-26 ENCOUNTER — Ambulatory Visit: Payer: BC Managed Care – PPO | Admitting: Urology

## 2024-03-11 ENCOUNTER — Ambulatory Visit: Payer: BC Managed Care – PPO | Admitting: Urology

## 2024-03-11 ENCOUNTER — Other Ambulatory Visit: Payer: Self-pay

## 2024-03-11 DIAGNOSIS — N200 Calculus of kidney: Secondary | ICD-10-CM

## 2024-06-12 IMAGING — CT CT RENAL STONE PROTOCOL
2 of 4 series · 16 of 46 positions shown, 18 images · non-contrast
Comparison: 09/25/2017

CLINICAL DATA: Right-sided flank pain for several hours



[Series 2: axial st · axial · 0.77mm/px · z∈[-750,-335]mm · 13 of 95 slices shown, 15 images]
[im 6/95  soft-tissue]
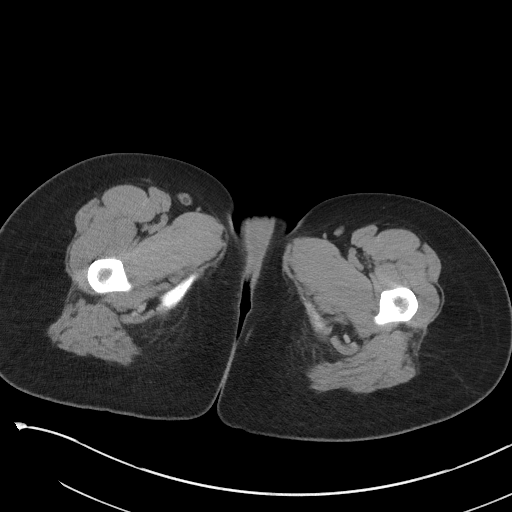
[im 6/95  bone]
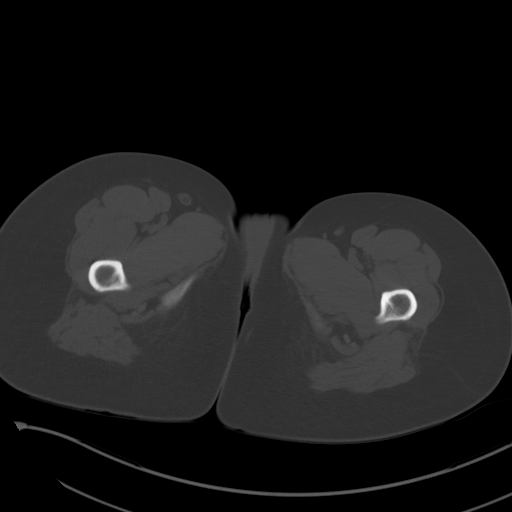
[im 12/95  soft-tissue]
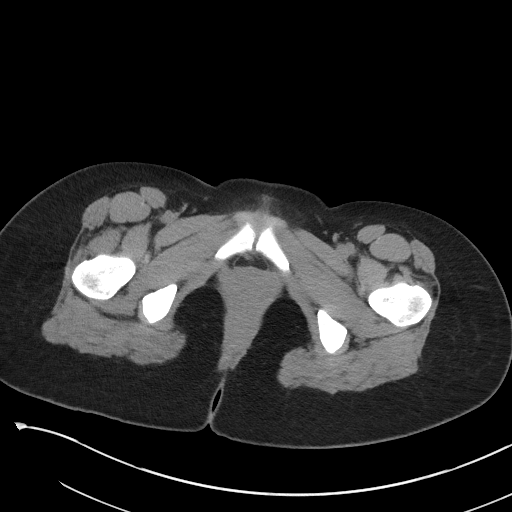
[im 23/95  soft-tissue]
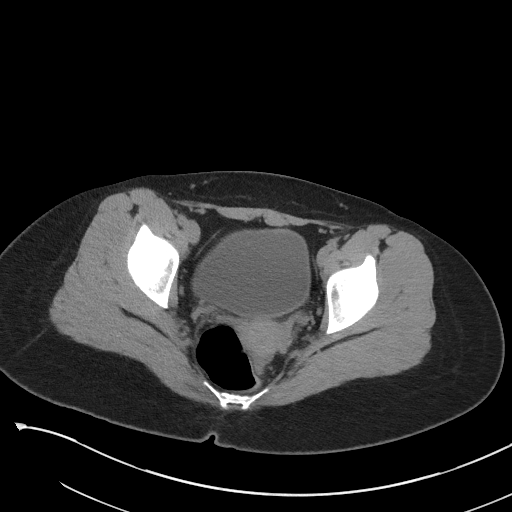
[im 28/95  soft-tissue]
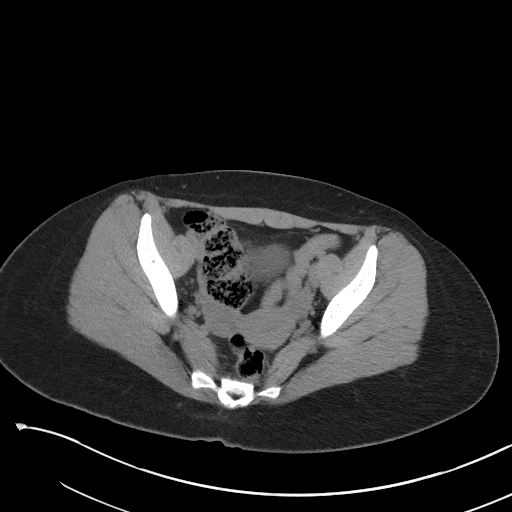
[im 34/95  soft-tissue]
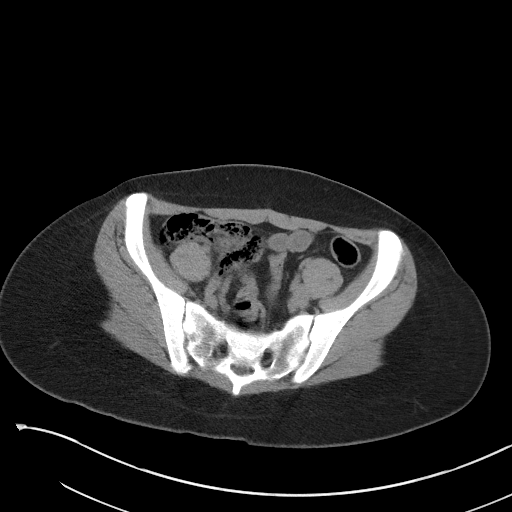
[im 39/95  soft-tissue]
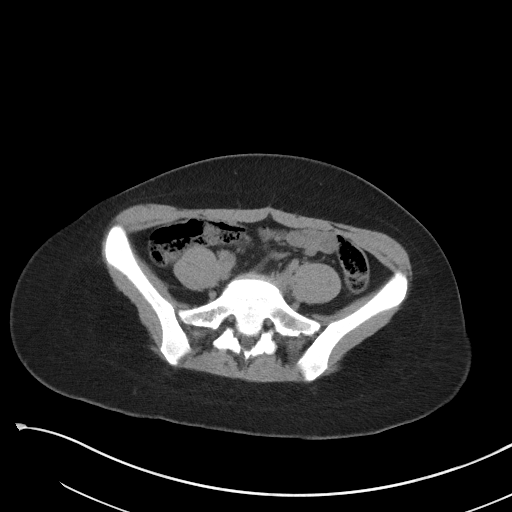
[im 50/95  soft-tissue]
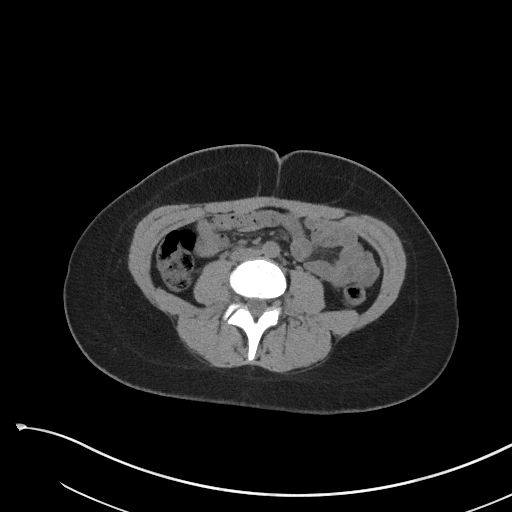
[im 56/95  soft-tissue]
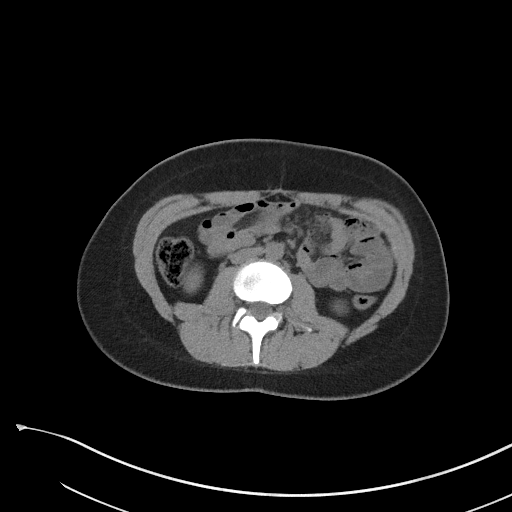
[im 61/95  soft-tissue]
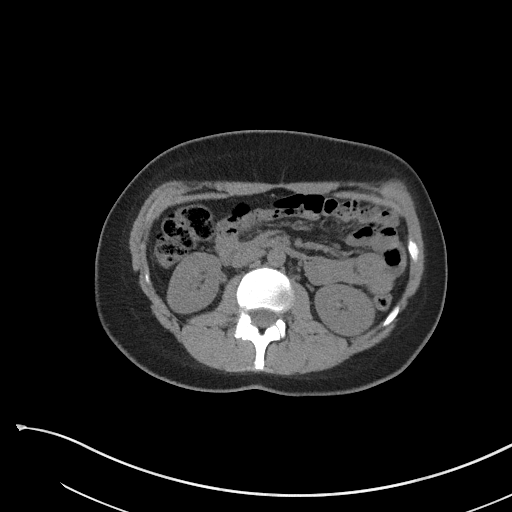
[im 61/95  bone]
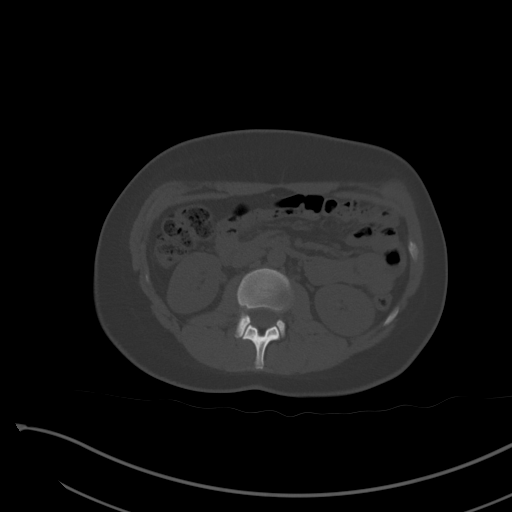
[im 67/95  soft-tissue]
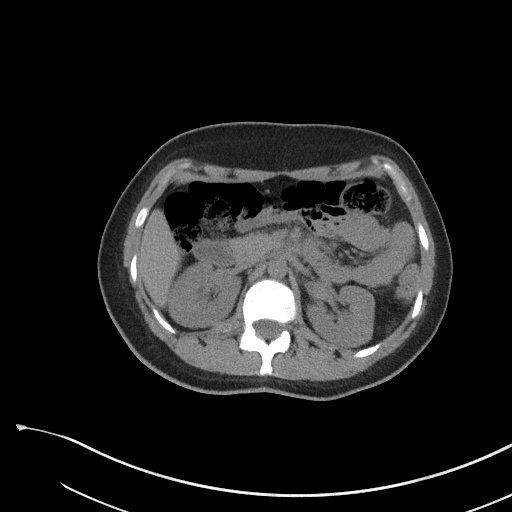
[im 72/95  soft-tissue]
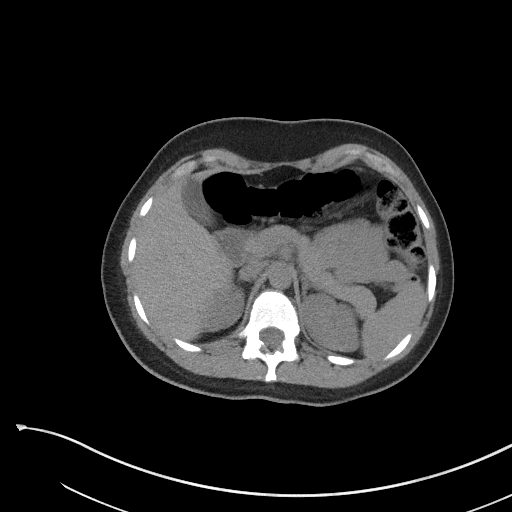
[im 83/95  soft-tissue]
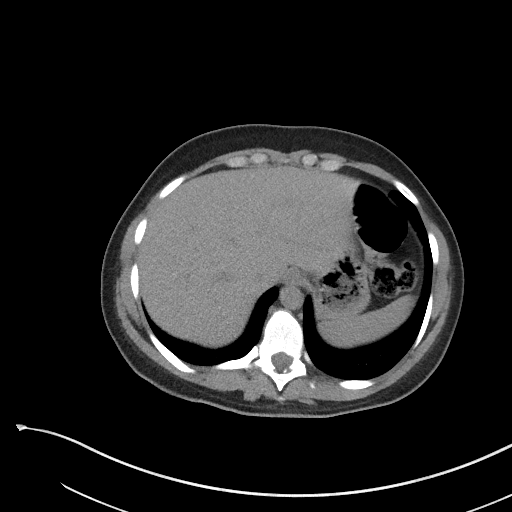
[im 89/95  soft-tissue]
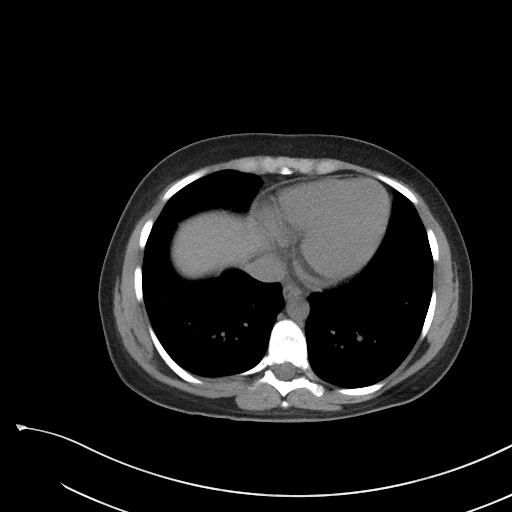

[Series 5: coronal st · coronal · 0.75mm/px · 3 of 91 slices shown]
[im 31/91  soft-tissue]
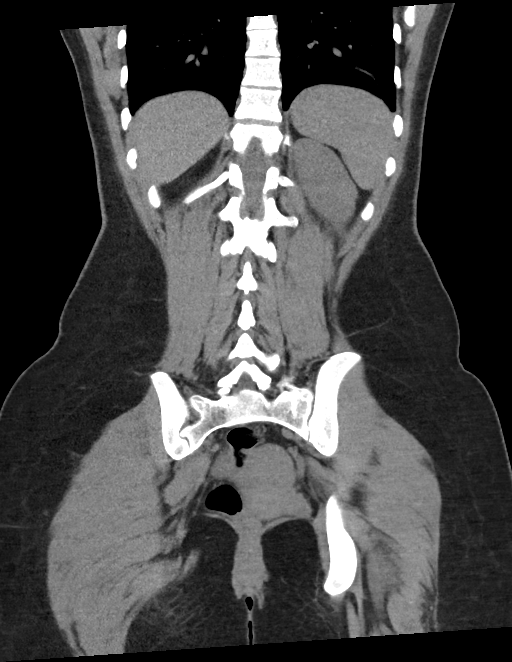
[im 41/91  soft-tissue]
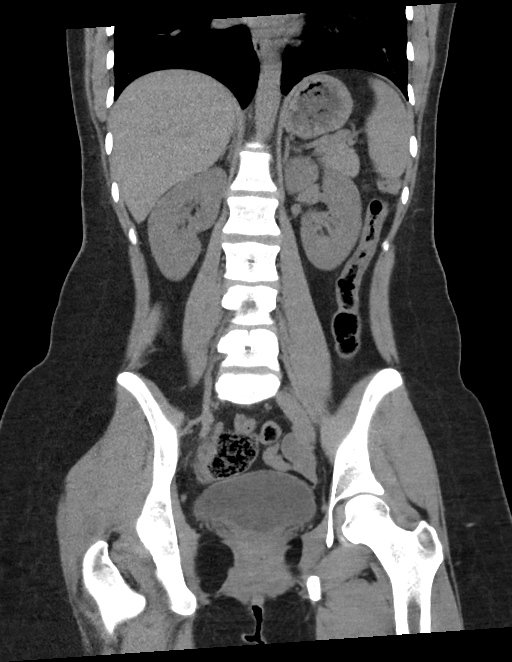
[im 51/91  soft-tissue]
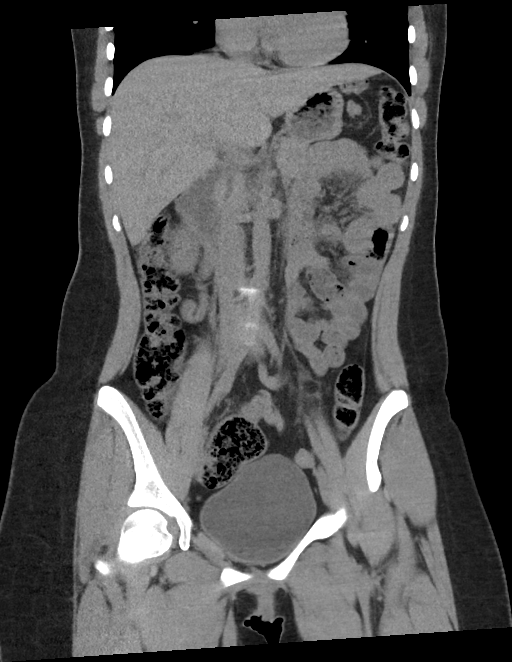

[16 of 46 positions shown; findings below may reference images not displayed]

FINDINGS: Lower chest: No acute abnormality.

Hepatobiliary: No focal liver abnormality is seen. No gallstones,
gallbladder wall thickening, or biliary dilatation.

Pancreas: Unremarkable. No pancreatic ductal dilatation or
surrounding inflammatory changes.

Spleen: Normal in size without focal abnormality.

Adrenals/Urinary Tract: Adrenal glands are within normal limits.
Kidneys are well visualized bilaterally. No renal calculi are seen.
Mild fullness of the right renal collecting system and right ureter
is noted which extends to the level of the ureterovesical junction.
Bladder is partially distended.

Stomach/Bowel: No obstructive or inflammatory changes of the colon
are noted. The appendix is well visualized and within normal limits.
Small bowel and stomach are within normal limits.

Vascular/Lymphatic: No significant vascular findings are present. No
enlarged abdominal or pelvic lymph nodes.

Reproductive: Uterus and bilateral adnexa are unremarkable.

Other: No abdominal wall hernia or abnormality. No abdominopelvic
ascites.

Musculoskeletal: No acute or significant osseous findings.
IMPRESSION: Fullness of the right renal collecting system and right ureter
extending to the level of the bladder as described. No definitive
stone is seen. These changes are likely related to a poorly
calcified stone distally or edema from recently passed stone.

No other focal abnormality is noted.
